# Patient Record
Sex: Female | Born: 1945 | Hispanic: No | Smoking: Never smoker
Health system: Southern US, Community
[De-identification: ages and names within clinical notes are randomized; demographics above are authoritative.]

## PROBLEM LIST (undated history)

## (undated) DIAGNOSIS — E119 Type 2 diabetes mellitus without complications: Secondary | ICD-10-CM

## (undated) DIAGNOSIS — E785 Hyperlipidemia, unspecified: Secondary | ICD-10-CM

## (undated) DIAGNOSIS — E079 Disorder of thyroid, unspecified: Secondary | ICD-10-CM

## (undated) DIAGNOSIS — M5136 Other intervertebral disc degeneration, lumbar region: Secondary | ICD-10-CM

## (undated) DIAGNOSIS — M51369 Other intervertebral disc degeneration, lumbar region without mention of lumbar back pain or lower extremity pain: Secondary | ICD-10-CM

## (undated) HISTORY — DX: Disorder of thyroid, unspecified: E07.9

## (undated) HISTORY — DX: Other intervertebral disc degeneration, lumbar region without mention of lumbar back pain or lower extremity pain: M51.369

## (undated) HISTORY — PX: TUBAL LIGATION: SHX77

## (undated) HISTORY — DX: Hyperlipidemia, unspecified: E78.5

## (undated) HISTORY — DX: Type 2 diabetes mellitus without complications: E11.9

## (undated) HISTORY — DX: Other intervertebral disc degeneration, lumbar region: M51.36

## (undated) HISTORY — PX: ROTATOR CUFF REPAIR: SHX139

---

## 2014-04-18 ENCOUNTER — Ambulatory Visit: Payer: Self-pay | Admitting: Family Medicine

## 2014-04-23 ENCOUNTER — Ambulatory Visit: Payer: Self-pay | Admitting: Internal Medicine

## 2014-04-23 LAB — CBC CANCER CENTER
BASOS PCT: 0.6 %
Basophil #: 0 x10 3/mm (ref 0.0–0.1)
Comment - H1-Com1: NORMAL
Eosinophil #: 0.2 x10 3/mm (ref 0.0–0.7)
Eosinophil %: 2.6 %
Eosinophil: 6 %
HCT: 39.4 % (ref 35.0–47.0)
HGB: 13.2 g/dL (ref 12.0–16.0)
LYMPHS ABS: 2.7 x10 3/mm (ref 1.0–3.6)
Lymphocyte %: 43.2 %
Lymphocytes: 42 %
MCH: 28.4 pg (ref 26.0–34.0)
MCHC: 33.6 g/dL (ref 32.0–36.0)
MCV: 85 fL (ref 80–100)
MONOS PCT: 6 %
MONOS PCT: 8.2 %
Monocyte #: 0.5 x10 3/mm (ref 0.2–0.9)
Neutrophil #: 2.9 x10 3/mm (ref 1.4–6.5)
Neutrophil %: 45.4 %
PLATELETS: 273 x10 3/mm (ref 150–440)
RBC: 4.65 10*6/uL (ref 3.80–5.20)
RDW: 13.6 % (ref 11.5–14.5)
SEGMENTED NEUTROPHILS: 46 %
WBC: 6.3 x10 3/mm (ref 3.6–11.0)

## 2014-04-23 LAB — LACTATE DEHYDROGENASE: LDH: 189 U/L (ref 81–246)

## 2014-04-23 LAB — LIPASE, BLOOD: LIPASE: 470 U/L — AB (ref 73–393)

## 2014-04-25 ENCOUNTER — Ambulatory Visit: Payer: Self-pay | Admitting: Internal Medicine

## 2015-01-05 DIAGNOSIS — E119 Type 2 diabetes mellitus without complications: Secondary | ICD-10-CM | POA: Diagnosis not present

## 2015-01-05 DIAGNOSIS — M25561 Pain in right knee: Secondary | ICD-10-CM | POA: Diagnosis not present

## 2015-01-05 DIAGNOSIS — E559 Vitamin D deficiency, unspecified: Secondary | ICD-10-CM | POA: Diagnosis not present

## 2015-01-05 DIAGNOSIS — E782 Mixed hyperlipidemia: Secondary | ICD-10-CM | POA: Diagnosis not present

## 2015-01-05 DIAGNOSIS — E039 Hypothyroidism, unspecified: Secondary | ICD-10-CM | POA: Diagnosis not present

## 2015-02-23 DIAGNOSIS — E119 Type 2 diabetes mellitus without complications: Secondary | ICD-10-CM | POA: Diagnosis not present

## 2015-02-23 DIAGNOSIS — J329 Chronic sinusitis, unspecified: Secondary | ICD-10-CM | POA: Diagnosis not present

## 2015-02-23 DIAGNOSIS — E785 Hyperlipidemia, unspecified: Secondary | ICD-10-CM | POA: Diagnosis not present

## 2015-03-09 DIAGNOSIS — E119 Type 2 diabetes mellitus without complications: Secondary | ICD-10-CM | POA: Diagnosis not present

## 2015-05-19 DIAGNOSIS — M1990 Unspecified osteoarthritis, unspecified site: Secondary | ICD-10-CM | POA: Diagnosis not present

## 2015-05-19 DIAGNOSIS — H6123 Impacted cerumen, bilateral: Secondary | ICD-10-CM | POA: Diagnosis not present

## 2015-10-28 DIAGNOSIS — E119 Type 2 diabetes mellitus without complications: Secondary | ICD-10-CM | POA: Diagnosis not present

## 2015-10-28 DIAGNOSIS — E559 Vitamin D deficiency, unspecified: Secondary | ICD-10-CM | POA: Diagnosis not present

## 2015-10-28 DIAGNOSIS — E039 Hypothyroidism, unspecified: Secondary | ICD-10-CM | POA: Diagnosis not present

## 2015-10-28 DIAGNOSIS — E782 Mixed hyperlipidemia: Secondary | ICD-10-CM | POA: Diagnosis not present

## 2015-11-04 DIAGNOSIS — M1712 Unilateral primary osteoarthritis, left knee: Secondary | ICD-10-CM | POA: Diagnosis not present

## 2015-11-04 DIAGNOSIS — E119 Type 2 diabetes mellitus without complications: Secondary | ICD-10-CM | POA: Diagnosis not present

## 2015-11-04 DIAGNOSIS — D649 Anemia, unspecified: Secondary | ICD-10-CM | POA: Diagnosis not present

## 2015-11-04 DIAGNOSIS — E7800 Pure hypercholesterolemia, unspecified: Secondary | ICD-10-CM | POA: Diagnosis not present

## 2015-11-04 DIAGNOSIS — E039 Hypothyroidism, unspecified: Secondary | ICD-10-CM | POA: Diagnosis not present

## 2015-11-13 DIAGNOSIS — D649 Anemia, unspecified: Secondary | ICD-10-CM | POA: Diagnosis not present

## 2016-03-26 DIAGNOSIS — M47816 Spondylosis without myelopathy or radiculopathy, lumbar region: Secondary | ICD-10-CM | POA: Diagnosis not present

## 2016-03-27 DIAGNOSIS — M25551 Pain in right hip: Secondary | ICD-10-CM | POA: Diagnosis not present

## 2016-03-27 DIAGNOSIS — M25561 Pain in right knee: Secondary | ICD-10-CM | POA: Diagnosis not present

## 2016-05-26 DIAGNOSIS — M47812 Spondylosis without myelopathy or radiculopathy, cervical region: Secondary | ICD-10-CM | POA: Diagnosis not present

## 2016-05-26 DIAGNOSIS — M5442 Lumbago with sciatica, left side: Secondary | ICD-10-CM | POA: Diagnosis not present

## 2016-05-26 DIAGNOSIS — E119 Type 2 diabetes mellitus without complications: Secondary | ICD-10-CM | POA: Diagnosis not present

## 2016-05-26 DIAGNOSIS — M5136 Other intervertebral disc degeneration, lumbar region: Secondary | ICD-10-CM | POA: Diagnosis not present

## 2016-05-26 DIAGNOSIS — S161XXA Strain of muscle, fascia and tendon at neck level, initial encounter: Secondary | ICD-10-CM | POA: Diagnosis not present

## 2016-05-26 DIAGNOSIS — H9193 Unspecified hearing loss, bilateral: Secondary | ICD-10-CM | POA: Diagnosis not present

## 2016-05-26 DIAGNOSIS — M5441 Lumbago with sciatica, right side: Secondary | ICD-10-CM | POA: Diagnosis not present

## 2016-06-20 DIAGNOSIS — H90A31 Mixed conductive and sensorineural hearing loss, unilateral, right ear with restricted hearing on the contralateral side: Secondary | ICD-10-CM | POA: Diagnosis not present

## 2016-06-20 DIAGNOSIS — H6123 Impacted cerumen, bilateral: Secondary | ICD-10-CM | POA: Diagnosis not present

## 2016-06-20 DIAGNOSIS — H6980 Other specified disorders of Eustachian tube, unspecified ear: Secondary | ICD-10-CM | POA: Diagnosis not present

## 2016-08-23 DIAGNOSIS — E119 Type 2 diabetes mellitus without complications: Secondary | ICD-10-CM | POA: Diagnosis not present

## 2016-08-23 DIAGNOSIS — M5442 Lumbago with sciatica, left side: Secondary | ICD-10-CM | POA: Diagnosis not present

## 2016-08-23 DIAGNOSIS — S161XXA Strain of muscle, fascia and tendon at neck level, initial encounter: Secondary | ICD-10-CM | POA: Diagnosis not present

## 2016-08-23 DIAGNOSIS — M5441 Lumbago with sciatica, right side: Secondary | ICD-10-CM | POA: Diagnosis not present

## 2016-08-30 DIAGNOSIS — E7800 Pure hypercholesterolemia, unspecified: Secondary | ICD-10-CM | POA: Diagnosis not present

## 2016-08-30 DIAGNOSIS — E119 Type 2 diabetes mellitus without complications: Secondary | ICD-10-CM | POA: Diagnosis not present

## 2016-08-30 DIAGNOSIS — E039 Hypothyroidism, unspecified: Secondary | ICD-10-CM | POA: Diagnosis not present

## 2016-09-12 ENCOUNTER — Encounter: Payer: Commercial Managed Care - HMO | Attending: Internal Medicine | Admitting: Dietician

## 2016-09-12 ENCOUNTER — Encounter: Payer: Self-pay | Admitting: Dietician

## 2016-09-12 VITALS — BP 122/72 | Ht 60.0 in | Wt 148.6 lb

## 2016-09-12 DIAGNOSIS — E119 Type 2 diabetes mellitus without complications: Secondary | ICD-10-CM | POA: Diagnosis not present

## 2016-09-12 DIAGNOSIS — E785 Hyperlipidemia, unspecified: Secondary | ICD-10-CM | POA: Diagnosis not present

## 2016-09-12 NOTE — Patient Instructions (Signed)
  Check blood sugars 2 x day before breakfast and 2 hrs after supper every day  Exercise:  Continue riding bike, elliptical and water aerobics  for    45  minutes   4  days a week  Avoid sugar sweetened drinks (soda, tea, coffee, sports drinks, juices)  Avoid fried foods and sweets/dessersts  Eat 3 meals day,   1  snacks a day at bedtime  Space meals 4-6 hours apart  Make dentist / eye doctor appointments  Bring blood sugar records to the next appointment/class  Get a Sharps container  Return for appointment/classes on:  Call about  Scheduling  classes after talking to insurance company about coverage

## 2016-09-12 NOTE — Progress Notes (Signed)
Diabetes Self-Management Education  Visit Type: First/Initial  Appt. Start Time: 1100 Appt. End Time: 1200  09/12/2016  [name], identified by name and date of birth, is a 70 y.o. female with a diagnosis of Diabetes: Type 2.   ASSESSMENT  Blood pressure 122/72, height 5' (1.524 m), weight 148 lb 9.6 oz (67.4 kg). Body mass index is 29.02 kg/m.  Pt reports having some pain in back and knees if walks and stands a lot which she avoids doing-no pain at present time      Diabetes Self-Management Education - 09/12/16 1212      Visit Information   Visit Type First/Initial     Initial Visit   Diabetes Type Type 2     Health Coping   How would you rate your overall health? Good     Psychosocial Assessment   Patient Belief/Attitude about Diabetes Motivated to manage diabetes   Self-care barriers None   Patient Concerns Glycemic Control;Weight Control   Special Needs None   Preferred Learning Style Auditory;Visual   Learning Readiness Ready     Pre-Education Assessment   Patient understands the diabetes disease and treatment process. Needs Review   Patient understands incorporating nutritional management into lifestyle. Needs Review   Patient undertands incorporating physical activity into lifestyle. Needs Review   Patient understands using medications safely. Needs Review   Patient understands monitoring blood glucose, interpreting and using results Needs Review   Patient understands prevention, detection, and treatment of acute complications. Needs Review   Patient understands prevention, detection, and treatment of chronic complications. Needs Review   Patient understands how to develop strategies to address psychosocial issues. Needs Review   Patient understands how to develop strategies to promote health/change behavior. Needs Review     Complications   Last HgB A1C per patient/outside source 7.3 %  08-23-16   How often do you check your blood sugar? --   1x/day   Fasting Blood glucose range (mg/dL) 40-981   Have you had a dilated eye exam in the past 12 months? No  about 1 year ago   Have you had a dental exam in the past 12 months? No  3 years ago   Are you checking your feet? Yes   How many days per week are you checking your feet? 7     Dietary Intake   Breakfast --  awakes at 5-7am-eats breakfast at 9:30am=usually eats oatmeal and fruit    Snack (morning) --  none   Lunch --  eats at 2-2:30p=eats egg or meat, rice, pita and vegetables   Snack (afternoon) --  eats snack at 5p=chips, unsweet tea   Dinner --  eats supper at 7:30p=yogurt, fruit, tortilla   Snack (evening) --  none   Beverage(s) --  drinks water 2-3x/day and unsweet tea and coffee 2-3x/day; milk x1/day     Exercise   Exercise Type --  goes to [employer]-cycles, elliptical and water aerobics 45 min 3-4x/wk.     Patient Education   Previous Diabetes Education No   Disease state  Definition of diabetes, type 1 and 2, and the diagnosis of diabetes;Explored patient's options for treatment of their diabetes   Nutrition management  Role of diet in the treatment of diabetes and the relationship between the three main macronutrients and blood glucose level;Food label reading, portion sizes and measuring food.;Carbohydrate counting   Physical activity and exercise  Role of exercise on diabetes management, blood pressure control and cardiac health.;Helped patient identify appropriate  exercises in relation to his/her diabetes, diabetes complications and other health issue.   Medications Reviewed patients medication for diabetes, action, purpose, timing of dose and side effects.   Monitoring Purpose and frequency of SMBG.;Identified appropriate SMBG and/or A1C goals.;Taught/discussed recording of test results and interpretation of SMBG.;Yearly dilated eye exam   Chronic complications Relationship between chronic complications and blood glucose control;Dental care;Retinopathy and  reason for yearly dilated eye exams   Psychosocial adjustment Role of stress on diabetes   Personal strategies to promote health Lifestyle issues that need to be addressed for better diabetes care;Helped patient develop diabetes management plan for (enter comment)      Individualized Plan for Diabetes Self-Management Training:   Learning Objective:  Patient will have a greater understanding of diabetes self-management. Patient education plan is to attend individual and/or group sessions per assessed needs and concerns.   Plan:   Patient Instructions   Check blood sugars 2 x day before breakfast and 2 hrs after supper every day  Exercise:  Continue riding bike, elliptical and water aerobics  for    45  minutes   4  days a week  Avoid sugar sweetened drinks (soda, tea, coffee, sports drinks, juices)  Avoid fried foods and sweets/dessersts  Eat 3 meals day,   1  snacks a day at bedtime  Space meals 4-6 hours apart  Make dentist / eye doctor appointments  Bring blood sugar records to the next appointment/class  Get a Sharps container  Return for appointment/classes on:  Call about  Scheduling  classes after talking to insurance company about coverage   Expected Outcomes:   positive  Education material provided: [employer] Meal Planning Guidelines, Living Well With Diabetes booklet, A1C handout  If problems or questions, patient to contact team via:  [phone]  Future DSME appointment:  pt to call about scheduling classes after talking with insurance company about coverage

## 2016-10-03 ENCOUNTER — Telehealth: Payer: Self-pay | Admitting: Dietician

## 2016-10-03 NOTE — Telephone Encounter (Signed)
Have not heard from pt-called pt's home number but wrong number. Called pt's daughter and asked that she have pt call me about scheduling diabetes classes.

## 2016-10-04 ENCOUNTER — Other Ambulatory Visit: Payer: Self-pay | Admitting: Internal Medicine

## 2016-10-04 DIAGNOSIS — Z1239 Encounter for other screening for malignant neoplasm of breast: Secondary | ICD-10-CM

## 2016-10-18 ENCOUNTER — Encounter: Payer: Self-pay | Admitting: Dietician

## 2016-10-18 NOTE — Progress Notes (Signed)
Have not heard from pt-pt discharged from program

## 2016-11-07 ENCOUNTER — Ambulatory Visit: Payer: Commercial Managed Care - HMO

## 2016-11-23 DIAGNOSIS — E119 Type 2 diabetes mellitus without complications: Secondary | ICD-10-CM | POA: Diagnosis not present

## 2016-11-24 DIAGNOSIS — E785 Hyperlipidemia, unspecified: Secondary | ICD-10-CM | POA: Diagnosis not present

## 2016-11-24 DIAGNOSIS — E119 Type 2 diabetes mellitus without complications: Secondary | ICD-10-CM | POA: Diagnosis not present

## 2016-11-24 DIAGNOSIS — E7800 Pure hypercholesterolemia, unspecified: Secondary | ICD-10-CM | POA: Diagnosis not present

## 2016-12-13 ENCOUNTER — Encounter (HOSPITAL_COMMUNITY): Payer: Self-pay

## 2016-12-13 ENCOUNTER — Ambulatory Visit
Admission: RE | Admit: 2016-12-13 | Discharge: 2016-12-13 | Disposition: A | Discharge status: Home / Self Care | Payer: Commercial Managed Care - HMO | Source: Ambulatory Visit | Attending: Internal Medicine | Admitting: Internal Medicine

## 2016-12-13 DIAGNOSIS — Z1239 Encounter for other screening for malignant neoplasm of breast: Secondary | ICD-10-CM

## 2016-12-13 DIAGNOSIS — Z1231 Encounter for screening mammogram for malignant neoplasm of breast: Secondary | ICD-10-CM | POA: Diagnosis not present

## 2016-12-23 ENCOUNTER — Other Ambulatory Visit: Payer: Self-pay | Admitting: *Deleted

## 2016-12-23 ENCOUNTER — Inpatient Hospital Stay
Admission: RE | Admit: 2016-12-23 | Discharge: 2016-12-23 | Disposition: A | Discharge status: Home / Self Care | Payer: Self-pay | Source: Ambulatory Visit | Attending: *Deleted | Admitting: *Deleted

## 2016-12-23 DIAGNOSIS — Z9289 Personal history of other medical treatment: Secondary | ICD-10-CM

## 2016-12-28 ENCOUNTER — Other Ambulatory Visit: Payer: Self-pay | Admitting: Internal Medicine

## 2016-12-28 DIAGNOSIS — N6320 Unspecified lump in the left breast, unspecified quadrant: Secondary | ICD-10-CM

## 2016-12-28 DIAGNOSIS — R928 Other abnormal and inconclusive findings on diagnostic imaging of breast: Secondary | ICD-10-CM

## 2017-01-04 ENCOUNTER — Ambulatory Visit
Admission: RE | Admit: 2017-01-04 | Discharge: 2017-01-04 | Disposition: A | Discharge status: Home / Self Care | Payer: Commercial Managed Care - HMO | Source: Ambulatory Visit | Attending: Internal Medicine | Admitting: Internal Medicine

## 2017-01-04 ENCOUNTER — Other Ambulatory Visit: Payer: Self-pay | Admitting: Internal Medicine

## 2017-01-04 DIAGNOSIS — N6489 Other specified disorders of breast: Secondary | ICD-10-CM | POA: Diagnosis not present

## 2017-01-04 DIAGNOSIS — N6320 Unspecified lump in the left breast, unspecified quadrant: Secondary | ICD-10-CM

## 2017-01-04 DIAGNOSIS — R928 Other abnormal and inconclusive findings on diagnostic imaging of breast: Secondary | ICD-10-CM

## 2017-03-01 DIAGNOSIS — E119 Type 2 diabetes mellitus without complications: Secondary | ICD-10-CM | POA: Diagnosis not present

## 2017-03-08 DIAGNOSIS — E039 Hypothyroidism, unspecified: Secondary | ICD-10-CM | POA: Diagnosis not present

## 2017-03-08 DIAGNOSIS — E119 Type 2 diabetes mellitus without complications: Secondary | ICD-10-CM | POA: Diagnosis not present

## 2017-03-08 DIAGNOSIS — E7800 Pure hypercholesterolemia, unspecified: Secondary | ICD-10-CM | POA: Diagnosis not present

## 2017-04-03 DIAGNOSIS — E119 Type 2 diabetes mellitus without complications: Secondary | ICD-10-CM | POA: Diagnosis not present

## 2017-04-03 DIAGNOSIS — M25562 Pain in left knee: Secondary | ICD-10-CM | POA: Diagnosis not present

## 2017-04-04 DIAGNOSIS — E119 Type 2 diabetes mellitus without complications: Secondary | ICD-10-CM | POA: Diagnosis not present

## 2017-04-06 DIAGNOSIS — M1712 Unilateral primary osteoarthritis, left knee: Secondary | ICD-10-CM | POA: Diagnosis not present

## 2017-07-11 DIAGNOSIS — E119 Type 2 diabetes mellitus without complications: Secondary | ICD-10-CM | POA: Diagnosis not present

## 2017-07-18 DIAGNOSIS — E039 Hypothyroidism, unspecified: Secondary | ICD-10-CM | POA: Diagnosis not present

## 2017-07-18 DIAGNOSIS — E7800 Pure hypercholesterolemia, unspecified: Secondary | ICD-10-CM | POA: Diagnosis not present

## 2017-07-18 DIAGNOSIS — Z23 Encounter for immunization: Secondary | ICD-10-CM | POA: Diagnosis not present

## 2017-07-18 DIAGNOSIS — E119 Type 2 diabetes mellitus without complications: Secondary | ICD-10-CM | POA: Diagnosis not present

## 2017-07-18 DIAGNOSIS — Z0000 Encounter for general adult medical examination without abnormal findings: Secondary | ICD-10-CM | POA: Diagnosis not present

## 2017-07-18 DIAGNOSIS — Z780 Asymptomatic menopausal state: Secondary | ICD-10-CM | POA: Diagnosis not present

## 2017-08-02 ENCOUNTER — Encounter: Payer: Self-pay | Admitting: *Deleted

## 2017-08-02 ENCOUNTER — Encounter: Payer: Commercial Managed Care - HMO | Attending: Internal Medicine | Admitting: *Deleted

## 2017-08-02 VITALS — BP 122/76 | Ht 60.0 in | Wt 150.7 lb

## 2017-08-02 DIAGNOSIS — Z713 Dietary counseling and surveillance: Secondary | ICD-10-CM | POA: Diagnosis not present

## 2017-08-02 DIAGNOSIS — E119 Type 2 diabetes mellitus without complications: Secondary | ICD-10-CM | POA: Insufficient documentation

## 2017-08-02 NOTE — Patient Instructions (Signed)
Check blood sugars 1 x day before breakfast or 2 hrs after lunch every day Bring blood sugar records to the next appointment  Exercise: Continue walking/cycling for   30  minutes   5 days a week  Eat 3 meals day,   1-2 snacks a day Space meals 4-6 hours apart Add protein serving to breakfast Allow 2 hours between meals and snacks Complete 3 Day Food Record and bring to next appt  Carry fast acting glucose and a snack at all times  Return for appointment on:  Will call tomorrow when hospital computer is up to schedule follow up appointment with the dietitian

## 2017-08-03 NOTE — Progress Notes (Signed)
Diabetes Self-Management Education  Visit Type: First/Initial  Appt. Start Time: 1340 Appt. End Time: 1455  08/02/2017  [name], identified by name and date of birth, is a 71 y.o. female with a diagnosis of Diabetes: Type 2.   ASSESSMENT  Blood pressure 122/76, height 5' (1.524 m), weight 150 lb 11.2 oz (68.4 kg). Body mass index is 29.43 kg/m.      Diabetes Self-Management Education - 08/02/17 1500      Visit Information   Visit Type First/Initial     Initial Visit   Diabetes Type Type 2   Are you currently following a meal plan? No   Are you taking your medications as prescribed? Yes   Date Diagnosed 15 years     Health Coping   How would you rate your overall health? Good     Psychosocial Assessment   Patient Belief/Attitude about Diabetes Motivated to manage diabetes   Self-care barriers None   Self-management support Doctor's office;Family   Patient Concerns Nutrition/Meal planning;Glycemic Control;Weight Control   Special Needs None   Preferred Learning Style Hands on   Learning Readiness Ready   How often do you need to have someone help you when you read instructions, pamphlets, or other written materials from your doctor or pharmacy? 1 - Never   What is the last grade level you completed in school? high school     Pre-Education Assessment   Patient understands the diabetes disease and treatment process. Needs Instruction   Patient understands incorporating nutritional management into lifestyle. Needs Instruction   Patient undertands incorporating physical activity into lifestyle. Needs Instruction   Patient understands using medications safely. Needs Instruction   Patient understands monitoring blood glucose, interpreting and using results Needs Review   Patient understands prevention, detection, and treatment of acute complications. Needs Instruction   Patient understands prevention, detection, and treatment of chronic complications. Needs  Instruction   Patient understands how to develop strategies to address psychosocial issues. Needs Instruction   Patient understands how to develop strategies to promote health/change behavior. Needs Instruction     Complications   Last HgB A1C per patient/outside source 7.3 %  07/11/17   How often do you check your blood sugar? 1-2 times/day   Fasting Blood glucose range (mg/dL) 16-109  Pt reports FBG's 100-120's mg/dL.   Number of hypoglycemic episodes per month 2   Can you tell when your blood sugar is low? Yes   What do you do if your blood sugar is low? Someone gave her juice. She ate crackers first and didn't help.    Have you had a dilated eye exam in the past 12 months? Yes   Have you had a dental exam in the past 12 months? No   Are you checking your feet? Yes   How many days per week are you checking your feet? 5     Dietary Intake   Breakfast banana, 1/3 cup oatmeal and 1/2 cup milk   Lunch [place] sandwich; wheat tortillas with kale, cuccmber, broccoli, cauliflower, egg plant, spinach   Snack (afternoon) 3-4 peanut butter crackers or [employer] chips with hummus   Dinner tortilla, fruit - cherries, cooked rice, beans, fish, tuna, shrimp   Beverage(s) water, unsweetened tea, coffee     Exercise   Exercise Type Moderate (swimming / aerobic walking)  walking, cycling   How many days per week to you exercise? 30   How many minutes per day do you exercise? 5   Total  minutes per week of exercise 150     Patient Education   Previous Diabetes Education Yes (please comment)  Pt came for 1:1 appt last year but never followed up with classes.    Disease state  Definition of diabetes, type 1 and 2, and the diagnosis of diabetes   Nutrition management  Role of diet in the treatment of diabetes and the relationship between the three main macronutrients and blood glucose level;Food label reading, portion sizes and measuring food.;Reviewed blood glucose goals for pre and post meals and  how to evaluate the patients' food intake on their blood glucose level.   Physical activity and exercise  Role of exercise on diabetes management, blood pressure control and cardiac health.   Medications Reviewed patients medication for diabetes, action, purpose, timing of dose and side effects.   Monitoring Purpose and frequency of SMBG.;Taught/discussed recording of test results and interpretation of SMBG.;Identified appropriate SMBG and/or A1C goals.  Pt requested fingerstick BG while at appt. BG was 135 mg/dL at 1:612:05 pm - 1 hr pp.    Acute complications Taught treatment of hypoglycemia - the 15 rule.   Chronic complications Relationship between chronic complications and blood glucose control;Dental care   Psychosocial adjustment Identified and addressed patients feelings and concerns about diabetes     Individualized Goals (developed by patient)   Reducing Risk Improve blood sugars Lose weight     Outcomes   Expected Outcomes Demonstrated interest in learning. Expect positive outcomes      Individualized Plan for Diabetes Self-Management Training:   Learning Objective:  Patient will have a greater understanding of diabetes self-management. Patient education plan is to attend individual and/or group sessions per assessed needs and concerns.   Plan:   Patient Instructions  Check blood sugars 1 x day before breakfast or 2 hrs after lunch every day Bring blood sugar records to the next appointment Exercise: Continue walking/cycling for   30  minutes   5 days a week Eat 3 meals day,   1-2 snacks a day Space meals 4-6 hours apart Add protein serving to breakfast Allow 2 hours between meals and snacks Complete 3 Day Food Record and bring to next appt Carry fast acting glucose and a snack at all times   Expected Outcomes:  Demonstrated interest in learning. Expect positive outcomes  Education material provided:  General Meal Planning Guidelines Simple Meal Plan 3 Day Food  Record Glucose tablets Symptoms, causes and treatments of Hypoglycemia  If problems or questions, patient to contact team via:  Sharion SettlerSheila Rob Mciver, RN, CCM, CDE [phone]  Future DSME appointment:  Will call tomorrow to schedule follow up with the dietitian when the hospital computer is up.

## 2017-08-15 ENCOUNTER — Encounter: Payer: Commercial Managed Care - HMO | Admitting: Dietician

## 2017-08-15 ENCOUNTER — Encounter: Payer: Self-pay | Admitting: Dietician

## 2017-08-15 DIAGNOSIS — Z713 Dietary counseling and surveillance: Secondary | ICD-10-CM | POA: Diagnosis not present

## 2017-08-15 DIAGNOSIS — E119 Type 2 diabetes mellitus without complications: Secondary | ICD-10-CM | POA: Diagnosis not present

## 2017-08-15 NOTE — Progress Notes (Signed)
Diabetes Self-Management Education  Visit Type:  Follow-up  Appt. Start Time: 1330 Appt. End Time: 1430  08/15/2017  Ms. Lezley Biglow, identified by name and date of birth, is a 71 y.o. female with a diagnosis of Diabetes:  Type 2 Diabetes.   ASSESSMENT  Height 5' (1.524 m), weight 150 lb 12.8 oz (68.4 kg). Body mass index is 29.45 kg/m.  BP- 120/68       Diabetes Self-Management Education - 08/15/17 1450      Complications   Last HgB A1C per patient/outside source 7.3 %   How often do you check your blood sugar? 1-2 times/day   Fasting Blood glucose range (mg/dL) 34-193   Postprandial Blood glucose range (mg/dL) 790-240   Have you had a dilated eye exam in the past 12 months? Yes   Have you had a dental exam in the past 12 months? No   Are you checking your feet? Yes   How many days per week are you checking your feet? 5     Dietary Intake   Breakfast oatmeal 2/3 cup cooked, 1/4 cup milk, 1/2 banana, 1 Tbsp peanut butter   Lunch 12 in tortilla or 10" Naan bread, with egg or [place], rice and/or beans, vegetables including broccoli, or cucumber or egg plant or squash   Snack (afternoon) 1 package of peanut butter/crackers or 100 calorie snack packs or apple/peanut butter occasionally with peanuts added   Dinner same as lunch   Snack (evening) yogurt    Beverage(s) water, unsweetened tea, coffee     Exercise   Exercise Type Light (walking / raking leaves)   How many days per week to you exercise? 5   How many minutes per day do you exercise? 30   Total minutes per week of exercise 150     Patient Education   Nutrition management  Role of diet in the treatment of diabetes and the relationship between the three main macronutrients and blood glucose level;Food label reading, portion sizes and measuring food.;Carbohydrate counting;Reviewed blood glucose goals for pre and post meals and how to evaluate the patients' food intake on their blood glucose level.   Physical  activity and exercise  Role of exercise on diabetes management, blood pressure control and cardiac health.   Acute complications --  reviewed prevention and treatment of hypoglycemia   Chronic complications Lipid levels, blood glucose control and heart disease     Individualized Goals (developed by patient)   Nutrition Follow meal plan discussed   Physical Activity Exercise 5-7 days per week   Monitoring  test my blood glucose as discussed   Reducing Risk treat hypoglycemia with 15 grams of carbs if blood glucose less than 70mg /dL;do foot checks daily     Post-Education Assessment   Patient understands the diabetes disease and treatment process. Demonstrates understanding / competency   Patient understands incorporating nutritional management into lifestyle. Demonstrates understanding / competency   Patient undertands incorporating physical activity into lifestyle. Demonstrates understanding / competency   Patient understands using medications safely. Demonstrates understanding / competency   Patient understands monitoring blood glucose, interpreting and using results Demonstrates understanding / competency   Patient understands prevention, detection, and treatment of acute complications. Demonstrates understanding / competency   Patient understands how to develop strategies to address psychosocial issues. Demonstrates understanding / competency   Patient understands how to develop strategies to promote health/change behavior. Demonstrates understanding / competency     Outcomes   Program Status Completed  Learning Objective:  Patient will have a greater understanding of diabetes self-management. Patient education plan is to attend individual and/or group sessions per assessed needs and concerns.   Plan:   Patient to balance meals with 1-3 oz protein, 2-3 servings of carbohydrate and non-starchy vegetables. To read labels for carbohydrate, keeping meals in a 30-50 gm carbohydrate  range. To use calorieking website to look up nutrition information for foods not listed in hand-out that was given. To continue to check FBG and 2 hour post meal glucose readings several days per week.  Expected Outcomes:  Demonstrated interest in learning. Expect positive outcomes  Education material provided:  Diabetes Food Guide Plate Planning a Balance Meal  If problems or questions, patient to contact team via:  Darrel Reach, RD  [phone]  Future DSME appointment: - PRN

## 2017-08-15 NOTE — Patient Instructions (Signed)
Patient to balance meals with 1-3 oz protein, 2-3 servings of carbohydrate and non-starchy vegetables. To read labels for carbohydrate, keeping meals in a 30-50 gm carbohydrate range. To use calorieking website to look up nutrition information for foods not listed in hand-out that was given. To continue to check FBG and 2 hour post meal glucose readings several days per week.

## 2017-10-11 DIAGNOSIS — E039 Hypothyroidism, unspecified: Secondary | ICD-10-CM | POA: Diagnosis not present

## 2017-10-11 DIAGNOSIS — E7800 Pure hypercholesterolemia, unspecified: Secondary | ICD-10-CM | POA: Diagnosis not present

## 2017-10-11 DIAGNOSIS — E119 Type 2 diabetes mellitus without complications: Secondary | ICD-10-CM | POA: Diagnosis not present

## 2017-10-18 DIAGNOSIS — E538 Deficiency of other specified B group vitamins: Secondary | ICD-10-CM | POA: Diagnosis not present

## 2017-10-18 DIAGNOSIS — Z0000 Encounter for general adult medical examination without abnormal findings: Secondary | ICD-10-CM | POA: Diagnosis not present

## 2017-10-18 DIAGNOSIS — Z780 Asymptomatic menopausal state: Secondary | ICD-10-CM | POA: Diagnosis not present

## 2017-10-18 DIAGNOSIS — R202 Paresthesia of skin: Secondary | ICD-10-CM | POA: Diagnosis not present

## 2017-10-18 DIAGNOSIS — E119 Type 2 diabetes mellitus without complications: Secondary | ICD-10-CM | POA: Diagnosis not present

## 2017-10-20 DIAGNOSIS — Z780 Asymptomatic menopausal state: Secondary | ICD-10-CM | POA: Diagnosis not present

## 2018-01-02 DIAGNOSIS — M1712 Unilateral primary osteoarthritis, left knee: Secondary | ICD-10-CM | POA: Diagnosis not present

## 2018-01-02 DIAGNOSIS — E119 Type 2 diabetes mellitus without complications: Secondary | ICD-10-CM | POA: Diagnosis not present

## 2018-01-02 DIAGNOSIS — E538 Deficiency of other specified B group vitamins: Secondary | ICD-10-CM | POA: Diagnosis not present

## 2018-01-02 DIAGNOSIS — E7800 Pure hypercholesterolemia, unspecified: Secondary | ICD-10-CM | POA: Diagnosis not present

## 2018-02-09 DIAGNOSIS — E538 Deficiency of other specified B group vitamins: Secondary | ICD-10-CM | POA: Diagnosis not present

## 2018-02-21 DIAGNOSIS — M1712 Unilateral primary osteoarthritis, left knee: Secondary | ICD-10-CM | POA: Diagnosis not present

## 2018-02-21 DIAGNOSIS — R100 Acute abdomen: Secondary | ICD-10-CM | POA: Diagnosis not present

## 2018-11-30 ENCOUNTER — Encounter: Payer: Self-pay | Admitting: Emergency Medicine

## 2018-11-30 ENCOUNTER — Other Ambulatory Visit: Payer: Self-pay

## 2018-11-30 ENCOUNTER — Emergency Department: Payer: PRIVATE HEALTH INSURANCE

## 2018-11-30 ENCOUNTER — Emergency Department
Admission: EM | Admit: 2018-11-30 | Discharge: 2018-11-30 | Disposition: A | Discharge status: Home / Self Care | Payer: PRIVATE HEALTH INSURANCE | Attending: Emergency Medicine | Admitting: Emergency Medicine

## 2018-11-30 DIAGNOSIS — Y9389 Activity, other specified: Secondary | ICD-10-CM | POA: Insufficient documentation

## 2018-11-30 DIAGNOSIS — S8001XA Contusion of right knee, initial encounter: Secondary | ICD-10-CM | POA: Insufficient documentation

## 2018-11-30 DIAGNOSIS — S60512A Abrasion of left hand, initial encounter: Secondary | ICD-10-CM | POA: Insufficient documentation

## 2018-11-30 DIAGNOSIS — E119 Type 2 diabetes mellitus without complications: Secondary | ICD-10-CM | POA: Insufficient documentation

## 2018-11-30 DIAGNOSIS — R51 Headache: Secondary | ICD-10-CM | POA: Insufficient documentation

## 2018-11-30 DIAGNOSIS — W01198A Fall on same level from slipping, tripping and stumbling with subsequent striking against other object, initial encounter: Secondary | ICD-10-CM | POA: Diagnosis not present

## 2018-11-30 DIAGNOSIS — Y92538 Other ambulatory health services establishments as the place of occurrence of the external cause: Secondary | ICD-10-CM | POA: Insufficient documentation

## 2018-11-30 DIAGNOSIS — Y999 Unspecified external cause status: Secondary | ICD-10-CM | POA: Diagnosis not present

## 2018-11-30 DIAGNOSIS — S8002XA Contusion of left knee, initial encounter: Secondary | ICD-10-CM | POA: Insufficient documentation

## 2018-11-30 DIAGNOSIS — W19XXXA Unspecified fall, initial encounter: Secondary | ICD-10-CM

## 2018-11-30 DIAGNOSIS — S0990XA Unspecified injury of head, initial encounter: Secondary | ICD-10-CM | POA: Diagnosis present

## 2018-11-30 DIAGNOSIS — T148XXA Other injury of unspecified body region, initial encounter: Secondary | ICD-10-CM

## 2018-11-30 MED ORDER — BACITRACIN ZINC 500 UNIT/GM EX OINT
TOPICAL_OINTMENT | CUTANEOUS | Status: AC
  Administered 2018-11-30: 11:00:00
  Filled 2018-11-30: qty 0.9

## 2018-11-30 NOTE — ED Triage Notes (Signed)
Patient reports walking into hospital and tripping on uneven sidewalk. Patient with scrapes to palm of left hand and knees. Patient also states that she hit the left side of her head on the sidewalk. Denies LOC.

## 2018-11-30 NOTE — Discharge Instructions (Addendum)
Please keep your abrasion on your hand clean and dry.  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  Return for any problems including increasing pain redness or swelling.  Tylenol for any pain.

## 2018-11-30 NOTE — ED Provider Notes (Signed)
[facility] Emergency Department Provider Note   ____________________________________________   First MD Initiated Contact with Patient 11/30/18 [phone]     (approximate)  I have reviewed the triage vital signs and the nursing notes.   HISTORY  Chief Complaint Fall    HPI Samantha Travis is a 72 y.o. female who works here in the hospital she was walking and tripped over an uneven area in the concrete fell she has abrasions on her left hand bruises on both knees and she hit her head as well.  She did not pass out but she has some pain in the area of the left temple and the left side of the orbit left orbit   Past Medical History:  Diagnosis Date  . Diabetes mellitus without complication (HCC)   . Thyroid disease     There are no active problems to display for this patient.   History reviewed. No pertinent surgical history.  Prior to Admission medications   Not on File    Allergies Penicillins  No family history on file.  Social History Social History   Tobacco Use  . Smoking status: Never Smoker  . Smokeless tobacco: Never Used  Substance Use Topics  . Alcohol use: Never    Frequency: Never  . Drug use: Not on file    Review of Systems  Constitutional: No fever/chills Eyes: No visual changes. ENT: No sore throat. Cardiovascular: Denies chest pain. Respiratory: Denies shortness of breath. Gastrointestinal: No abdominal pain.  No nausea, no vomiting.  No diarrhea.  No constipation. Genitourinary: Negative for dysuria. Musculoskeletal: Negative for back pain. Skin: Negative for rash. Neurological: Negative for focal weakness patient does have a headache on the left side of her head where she hit it ____________________________________________   PHYSICAL EXAM:  VITAL SIGNS: ED Triage Vitals [11/30/18 0810]  Enc Vitals Group     BP (!) 182/86     Pulse Rate 83     Resp 16     Temp 98.1 F (36.7 C)     Temp Source Oral       SpO2 100 %     Weight 150 lb (68 kg)     Height 5' (1.524 m)     Head Circumference      Peak Flow      Pain Score 0     Pain Loc      Pain Edu?      Excl. in GC?     Constitutional: Alert and oriented. Well appearing and in no acute distress. Eyes: Conjunctivae are normal.  Head: Atraumatic although painful in the area described in HPI from the fall. Nose: No congestion/rhinnorhea. Mouth/Throat: Mucous membranes are moist.  Oropharynx non-erythematous. Neck: No stridor.  No cervical tenderness  cardiovascular: Normal rate, regular rhythm. Grossly normal heart sounds.  Good peripheral circulation. Respiratory: Normal respiratory effort.  No retractions. Lungs CTAB. Gastrointestinal: Soft and nontender. No distention. No abdominal bruits. Musculoskeletal: No lower extremity  Edema both knees are bruised.  The left hand has an abrasion over the thenar eminence and somewhat tender in the dorsum of the palm of the hand and distal wrist as well..   Neurologic:  Normal speech and language. No gross focal neurologic deficits are appreciated.  Skin:  Skin is warm, dry and intact except for as noted above in musculoskeletal. No rash noted. Psychiatric: Mood and affect are normal. Speech and behavior are normal.  ____________________________________________   LABS (all labs ordered are listed, but  only abnormal results are displayed)  Labs Reviewed - No data to display ____________________________________________  EKG   ____________________________________________  RADIOLOGY  ED MD interpretation: CT and wrist x-ray read as no acute problems by radiology I reviewed all the films  Official radiology report(s): Ct Head Wo Contrast  Result Date: 11/30/2018 CLINICAL DATA:  Recent trip and fall with headaches, initial encounter EXAM: CT HEAD WITHOUT CONTRAST TECHNIQUE: Contiguous axial images were obtained from the base of the skull through the vertex without intravenous contrast.  COMPARISON:  None. FINDINGS: Brain: No findings to suggest acute hemorrhage, acute infarction or space-occupying mass lesion are seen. Very mild chronic white matter ischemic changes are noted in the deep white matter on the left without significant sulcal blunting. Vascular: No hyperdense vessel or unexpected calcification. Skull: Normal. Negative for fracture or focal lesion. Sinuses/Orbits: No acute finding. Other: None. IMPRESSION: Mild chronic white matter ischemic change. No acute abnormality noted. Electronically Signed   By: Alcide Clever M.D.   On: 11/30/2018 09:18   Dg Hand Complete Left  Result Date: 11/30/2018 CLINICAL DATA:  Left hand injury due to a trip and fall today. Initial encounter. EXAM: LEFT HAND - COMPLETE 3+ VIEW COMPARISON:  None. FINDINGS: No acute bony or joint abnormality is identified. Scattered interphalangeal joint space narrowing and osteophytosis consistent with osteoarthritis noted. There is also mild degenerative change at the first [facility] joint. Soft tissues are unremarkable. IMPRESSION: No acute abnormality. Scattered osteoarthritis. Electronically Signed   By: Drusilla Kanner M.D.   On: 11/30/2018 09:08    ____________________________________________   PROCEDURES  Procedure(s) performed:  Procedures  Critical Care performed:   ____________________________________________   INITIAL IMPRESSION / ASSESSMENT AND PLAN / ED COURSE  We will clean and dress the abrasion on patient's hand otherwise she says her tetanus shots up-to-date is been within the last 10 years we will let her go         ____________________________________________   FINAL CLINICAL IMPRESSION(S) / ED DIAGNOSES  Final diagnoses:  Fall, initial encounter  Abrasion     ED Discharge Orders    None       Note:  This document was prepared using Dragon voice recognition software and may include unintentional dictation errors.    Arnaldo Natal, MD 11/30/18 [phone]

## 2018-12-03 ENCOUNTER — Encounter: Payer: Self-pay | Admitting: Dietician

## 2018-12-24 ENCOUNTER — Ambulatory Visit
Admission: RE | Admit: 2018-12-24 | Discharge: 2018-12-24 | Disposition: A | Discharge status: Home / Self Care | Payer: PRIVATE HEALTH INSURANCE | Attending: Family Medicine | Admitting: Family Medicine

## 2018-12-24 ENCOUNTER — Other Ambulatory Visit: Payer: Self-pay | Admitting: Family Medicine

## 2018-12-24 ENCOUNTER — Other Ambulatory Visit: Payer: Self-pay | Admitting: Internal Medicine

## 2018-12-24 ENCOUNTER — Ambulatory Visit
Admission: RE | Admit: 2018-12-24 | Discharge: 2018-12-24 | Disposition: A | Discharge status: Home / Self Care | Payer: PRIVATE HEALTH INSURANCE | Source: Ambulatory Visit | Attending: Family Medicine | Admitting: Family Medicine

## 2018-12-24 DIAGNOSIS — M25532 Pain in left wrist: Secondary | ICD-10-CM | POA: Insufficient documentation

## 2018-12-24 DIAGNOSIS — R52 Pain, unspecified: Secondary | ICD-10-CM

## 2018-12-24 DIAGNOSIS — Z1231 Encounter for screening mammogram for malignant neoplasm of breast: Secondary | ICD-10-CM

## 2020-04-09 ENCOUNTER — Other Ambulatory Visit: Payer: Self-pay | Admitting: Internal Medicine

## 2020-04-09 DIAGNOSIS — R945 Abnormal results of liver function studies: Secondary | ICD-10-CM

## 2020-04-09 DIAGNOSIS — R7989 Other specified abnormal findings of blood chemistry: Secondary | ICD-10-CM

## 2020-04-17 ENCOUNTER — Other Ambulatory Visit: Payer: Self-pay

## 2020-04-17 ENCOUNTER — Ambulatory Visit
Admission: RE | Admit: 2020-04-17 | Discharge: 2020-04-17 | Disposition: A | Discharge status: Home / Self Care | Payer: Medicare HMO | Source: Ambulatory Visit | Attending: Internal Medicine | Admitting: Internal Medicine

## 2020-04-17 DIAGNOSIS — R7989 Other specified abnormal findings of blood chemistry: Secondary | ICD-10-CM

## 2020-04-17 DIAGNOSIS — R945 Abnormal results of liver function studies: Secondary | ICD-10-CM | POA: Diagnosis not present

## 2020-07-16 ENCOUNTER — Other Ambulatory Visit: Payer: Self-pay | Admitting: Internal Medicine

## 2020-07-16 DIAGNOSIS — Z1231 Encounter for screening mammogram for malignant neoplasm of breast: Secondary | ICD-10-CM

## 2021-03-22 DIAGNOSIS — E7800 Pure hypercholesterolemia, unspecified: Secondary | ICD-10-CM | POA: Diagnosis not present

## 2021-03-22 DIAGNOSIS — E538 Deficiency of other specified B group vitamins: Secondary | ICD-10-CM | POA: Diagnosis not present

## 2021-03-22 DIAGNOSIS — E559 Vitamin D deficiency, unspecified: Secondary | ICD-10-CM | POA: Diagnosis not present

## 2021-03-22 DIAGNOSIS — E119 Type 2 diabetes mellitus without complications: Secondary | ICD-10-CM | POA: Diagnosis not present

## 2021-03-22 DIAGNOSIS — E1165 Type 2 diabetes mellitus with hyperglycemia: Secondary | ICD-10-CM | POA: Diagnosis not present

## 2021-03-22 DIAGNOSIS — Z0000 Encounter for general adult medical examination without abnormal findings: Secondary | ICD-10-CM | POA: Diagnosis not present

## 2021-03-22 DIAGNOSIS — E039 Hypothyroidism, unspecified: Secondary | ICD-10-CM | POA: Diagnosis not present

## 2021-03-26 ENCOUNTER — Other Ambulatory Visit: Payer: Self-pay | Admitting: Internal Medicine

## 2021-03-26 DIAGNOSIS — Z1231 Encounter for screening mammogram for malignant neoplasm of breast: Secondary | ICD-10-CM | POA: Diagnosis not present

## 2021-03-26 DIAGNOSIS — E119 Type 2 diabetes mellitus without complications: Secondary | ICD-10-CM | POA: Diagnosis not present

## 2021-03-26 DIAGNOSIS — E039 Hypothyroidism, unspecified: Secondary | ICD-10-CM | POA: Diagnosis not present

## 2021-03-26 DIAGNOSIS — Z0000 Encounter for general adult medical examination without abnormal findings: Secondary | ICD-10-CM | POA: Diagnosis not present

## 2021-03-26 DIAGNOSIS — R519 Headache, unspecified: Secondary | ICD-10-CM | POA: Diagnosis not present

## 2021-03-29 ENCOUNTER — Other Ambulatory Visit: Payer: Self-pay | Admitting: Internal Medicine

## 2021-04-07 ENCOUNTER — Other Ambulatory Visit: Payer: Self-pay

## 2021-04-07 ENCOUNTER — Ambulatory Visit
Admission: RE | Admit: 2021-04-07 | Discharge: 2021-04-07 | Disposition: A | Discharge status: Home / Self Care | Payer: Medicare HMO | Source: Ambulatory Visit | Attending: Internal Medicine | Admitting: Internal Medicine

## 2021-04-07 DIAGNOSIS — Z1231 Encounter for screening mammogram for malignant neoplasm of breast: Secondary | ICD-10-CM | POA: Diagnosis not present

## 2021-05-03 DIAGNOSIS — M7731 Calcaneal spur, right foot: Secondary | ICD-10-CM | POA: Diagnosis not present

## 2021-05-03 DIAGNOSIS — M19071 Primary osteoarthritis, right ankle and foot: Secondary | ICD-10-CM | POA: Diagnosis not present

## 2021-05-03 DIAGNOSIS — M79671 Pain in right foot: Secondary | ICD-10-CM | POA: Diagnosis not present

## 2021-05-03 DIAGNOSIS — E785 Hyperlipidemia, unspecified: Secondary | ICD-10-CM | POA: Diagnosis not present

## 2021-05-03 DIAGNOSIS — M1712 Unilateral primary osteoarthritis, left knee: Secondary | ICD-10-CM | POA: Diagnosis not present

## 2021-05-03 DIAGNOSIS — M79672 Pain in left foot: Secondary | ICD-10-CM | POA: Diagnosis not present

## 2021-05-03 DIAGNOSIS — E119 Type 2 diabetes mellitus without complications: Secondary | ICD-10-CM | POA: Diagnosis not present

## 2021-05-03 DIAGNOSIS — E039 Hypothyroidism, unspecified: Secondary | ICD-10-CM | POA: Diagnosis not present

## 2021-05-03 DIAGNOSIS — Z79899 Other long term (current) drug therapy: Secondary | ICD-10-CM | POA: Diagnosis not present

## 2021-07-19 DIAGNOSIS — Z1231 Encounter for screening mammogram for malignant neoplasm of breast: Secondary | ICD-10-CM | POA: Diagnosis not present

## 2021-07-19 DIAGNOSIS — E039 Hypothyroidism, unspecified: Secondary | ICD-10-CM | POA: Diagnosis not present

## 2021-07-19 DIAGNOSIS — E119 Type 2 diabetes mellitus without complications: Secondary | ICD-10-CM | POA: Diagnosis not present

## 2021-07-19 DIAGNOSIS — R519 Headache, unspecified: Secondary | ICD-10-CM | POA: Diagnosis not present

## 2021-08-02 DIAGNOSIS — E119 Type 2 diabetes mellitus without complications: Secondary | ICD-10-CM | POA: Diagnosis not present

## 2021-08-02 DIAGNOSIS — E039 Hypothyroidism, unspecified: Secondary | ICD-10-CM | POA: Diagnosis not present

## 2021-08-02 DIAGNOSIS — M79671 Pain in right foot: Secondary | ICD-10-CM | POA: Diagnosis not present

## 2021-08-02 DIAGNOSIS — R0789 Other chest pain: Secondary | ICD-10-CM | POA: Diagnosis not present

## 2021-08-06 DIAGNOSIS — N899 Noninflammatory disorder of vagina, unspecified: Secondary | ICD-10-CM | POA: Diagnosis not present

## 2021-08-06 DIAGNOSIS — M5489 Other dorsalgia: Secondary | ICD-10-CM | POA: Diagnosis not present

## 2021-08-06 DIAGNOSIS — R0789 Other chest pain: Secondary | ICD-10-CM | POA: Diagnosis not present

## 2021-09-15 DIAGNOSIS — E119 Type 2 diabetes mellitus without complications: Secondary | ICD-10-CM | POA: Diagnosis not present

## 2021-09-15 DIAGNOSIS — Z0100 Encounter for examination of eyes and vision without abnormal findings: Secondary | ICD-10-CM | POA: Diagnosis not present

## 2021-09-29 DIAGNOSIS — Z0100 Encounter for examination of eyes and vision without abnormal findings: Secondary | ICD-10-CM | POA: Diagnosis not present

## 2021-11-24 DIAGNOSIS — M79671 Pain in right foot: Secondary | ICD-10-CM | POA: Diagnosis not present

## 2021-11-24 DIAGNOSIS — R0789 Other chest pain: Secondary | ICD-10-CM | POA: Diagnosis not present

## 2021-11-24 DIAGNOSIS — E1165 Type 2 diabetes mellitus with hyperglycemia: Secondary | ICD-10-CM | POA: Diagnosis not present

## 2021-11-24 DIAGNOSIS — E039 Hypothyroidism, unspecified: Secondary | ICD-10-CM | POA: Diagnosis not present

## 2021-12-01 DIAGNOSIS — E039 Hypothyroidism, unspecified: Secondary | ICD-10-CM | POA: Diagnosis not present

## 2021-12-01 DIAGNOSIS — E119 Type 2 diabetes mellitus without complications: Secondary | ICD-10-CM | POA: Diagnosis not present

## 2021-12-01 DIAGNOSIS — M1712 Unilateral primary osteoarthritis, left knee: Secondary | ICD-10-CM | POA: Diagnosis not present

## 2021-12-01 DIAGNOSIS — E785 Hyperlipidemia, unspecified: Secondary | ICD-10-CM | POA: Diagnosis not present

## 2022-03-21 DIAGNOSIS — E039 Hypothyroidism, unspecified: Secondary | ICD-10-CM | POA: Diagnosis not present

## 2022-03-21 DIAGNOSIS — E119 Type 2 diabetes mellitus without complications: Secondary | ICD-10-CM | POA: Diagnosis not present

## 2022-03-21 DIAGNOSIS — R0781 Pleurodynia: Secondary | ICD-10-CM | POA: Diagnosis not present

## 2022-03-21 DIAGNOSIS — M898X1 Other specified disorders of bone, shoulder: Secondary | ICD-10-CM | POA: Diagnosis not present

## 2022-03-21 DIAGNOSIS — R079 Chest pain, unspecified: Secondary | ICD-10-CM | POA: Diagnosis not present

## 2022-03-24 ENCOUNTER — Other Ambulatory Visit: Payer: Self-pay | Admitting: Internal Medicine

## 2022-03-24 DIAGNOSIS — Z1231 Encounter for screening mammogram for malignant neoplasm of breast: Secondary | ICD-10-CM

## 2022-04-04 DIAGNOSIS — E039 Hypothyroidism, unspecified: Secondary | ICD-10-CM | POA: Diagnosis not present

## 2022-04-04 DIAGNOSIS — E7800 Pure hypercholesterolemia, unspecified: Secondary | ICD-10-CM | POA: Diagnosis not present

## 2022-04-04 DIAGNOSIS — E1165 Type 2 diabetes mellitus with hyperglycemia: Secondary | ICD-10-CM | POA: Diagnosis not present

## 2022-04-04 DIAGNOSIS — M1712 Unilateral primary osteoarthritis, left knee: Secondary | ICD-10-CM | POA: Diagnosis not present

## 2022-04-13 DIAGNOSIS — E039 Hypothyroidism, unspecified: Secondary | ICD-10-CM | POA: Diagnosis not present

## 2022-04-13 DIAGNOSIS — Z1389 Encounter for screening for other disorder: Secondary | ICD-10-CM | POA: Diagnosis not present

## 2022-04-13 DIAGNOSIS — E119 Type 2 diabetes mellitus without complications: Secondary | ICD-10-CM | POA: Diagnosis not present

## 2022-04-13 DIAGNOSIS — Z6828 Body mass index (BMI) 28.0-28.9, adult: Secondary | ICD-10-CM | POA: Diagnosis not present

## 2022-04-13 DIAGNOSIS — Z0000 Encounter for general adult medical examination without abnormal findings: Secondary | ICD-10-CM | POA: Diagnosis not present

## 2022-04-13 DIAGNOSIS — M1712 Unilateral primary osteoarthritis, left knee: Secondary | ICD-10-CM | POA: Diagnosis not present

## 2022-04-13 DIAGNOSIS — E785 Hyperlipidemia, unspecified: Secondary | ICD-10-CM | POA: Diagnosis not present

## 2022-04-27 ENCOUNTER — Ambulatory Visit
Admission: RE | Admit: 2022-04-27 | Discharge: 2022-04-27 | Disposition: A | Discharge status: Home / Self Care | Payer: Medicare HMO | Source: Ambulatory Visit | Attending: Internal Medicine | Admitting: Internal Medicine

## 2022-04-27 DIAGNOSIS — Z1231 Encounter for screening mammogram for malignant neoplasm of breast: Secondary | ICD-10-CM | POA: Insufficient documentation

## 2022-07-07 DIAGNOSIS — E1165 Type 2 diabetes mellitus with hyperglycemia: Secondary | ICD-10-CM | POA: Diagnosis not present

## 2022-07-07 DIAGNOSIS — E7800 Pure hypercholesterolemia, unspecified: Secondary | ICD-10-CM | POA: Diagnosis not present

## 2022-07-07 DIAGNOSIS — E039 Hypothyroidism, unspecified: Secondary | ICD-10-CM | POA: Diagnosis not present

## 2022-07-13 DIAGNOSIS — E785 Hyperlipidemia, unspecified: Secondary | ICD-10-CM | POA: Diagnosis not present

## 2022-07-13 DIAGNOSIS — M5416 Radiculopathy, lumbar region: Secondary | ICD-10-CM | POA: Diagnosis not present

## 2022-07-13 DIAGNOSIS — E039 Hypothyroidism, unspecified: Secondary | ICD-10-CM | POA: Diagnosis not present

## 2022-07-13 DIAGNOSIS — M1712 Unilateral primary osteoarthritis, left knee: Secondary | ICD-10-CM | POA: Diagnosis not present

## 2022-07-13 DIAGNOSIS — E119 Type 2 diabetes mellitus without complications: Secondary | ICD-10-CM | POA: Diagnosis not present

## 2022-08-24 DIAGNOSIS — H6123 Impacted cerumen, bilateral: Secondary | ICD-10-CM | POA: Diagnosis not present

## 2022-08-31 DIAGNOSIS — Z6828 Body mass index (BMI) 28.0-28.9, adult: Secondary | ICD-10-CM | POA: Diagnosis not present

## 2022-08-31 DIAGNOSIS — H6123 Impacted cerumen, bilateral: Secondary | ICD-10-CM | POA: Diagnosis not present

## 2022-08-31 DIAGNOSIS — H66003 Acute suppurative otitis media without spontaneous rupture of ear drum, bilateral: Secondary | ICD-10-CM | POA: Diagnosis not present

## 2022-10-31 DIAGNOSIS — M5136 Other intervertebral disc degeneration, lumbar region: Secondary | ICD-10-CM | POA: Diagnosis not present

## 2022-10-31 DIAGNOSIS — E1165 Type 2 diabetes mellitus with hyperglycemia: Secondary | ICD-10-CM | POA: Diagnosis not present

## 2022-10-31 DIAGNOSIS — E559 Vitamin D deficiency, unspecified: Secondary | ICD-10-CM | POA: Diagnosis not present

## 2022-10-31 DIAGNOSIS — M1712 Unilateral primary osteoarthritis, left knee: Secondary | ICD-10-CM | POA: Diagnosis not present

## 2022-10-31 DIAGNOSIS — E7800 Pure hypercholesterolemia, unspecified: Secondary | ICD-10-CM | POA: Diagnosis not present

## 2022-10-31 DIAGNOSIS — E039 Hypothyroidism, unspecified: Secondary | ICD-10-CM | POA: Diagnosis not present

## 2022-11-07 DIAGNOSIS — E785 Hyperlipidemia, unspecified: Secondary | ICD-10-CM | POA: Diagnosis not present

## 2022-11-07 DIAGNOSIS — D649 Anemia, unspecified: Secondary | ICD-10-CM | POA: Diagnosis not present

## 2022-11-07 DIAGNOSIS — E119 Type 2 diabetes mellitus without complications: Secondary | ICD-10-CM | POA: Diagnosis not present

## 2022-11-07 DIAGNOSIS — M5136 Other intervertebral disc degeneration, lumbar region: Secondary | ICD-10-CM | POA: Diagnosis not present

## 2022-11-07 DIAGNOSIS — E039 Hypothyroidism, unspecified: Secondary | ICD-10-CM | POA: Diagnosis not present

## 2022-11-07 DIAGNOSIS — I10 Essential (primary) hypertension: Secondary | ICD-10-CM | POA: Diagnosis not present

## 2022-11-07 DIAGNOSIS — Z6829 Body mass index (BMI) 29.0-29.9, adult: Secondary | ICD-10-CM | POA: Diagnosis not present

## 2022-11-14 DIAGNOSIS — I10 Essential (primary) hypertension: Secondary | ICD-10-CM | POA: Diagnosis not present

## 2022-11-21 DIAGNOSIS — I10 Essential (primary) hypertension: Secondary | ICD-10-CM | POA: Diagnosis not present

## 2022-12-08 DIAGNOSIS — E119 Type 2 diabetes mellitus without complications: Secondary | ICD-10-CM | POA: Diagnosis not present

## 2022-12-08 DIAGNOSIS — H524 Presbyopia: Secondary | ICD-10-CM | POA: Diagnosis not present

## 2023-01-19 DIAGNOSIS — R5383 Other fatigue: Secondary | ICD-10-CM | POA: Diagnosis not present

## 2023-01-19 DIAGNOSIS — R5381 Other malaise: Secondary | ICD-10-CM | POA: Diagnosis not present

## 2023-01-19 DIAGNOSIS — E785 Hyperlipidemia, unspecified: Secondary | ICD-10-CM | POA: Diagnosis not present

## 2023-01-19 DIAGNOSIS — R0789 Other chest pain: Secondary | ICD-10-CM | POA: Diagnosis not present

## 2023-01-19 DIAGNOSIS — L918 Other hypertrophic disorders of the skin: Secondary | ICD-10-CM | POA: Diagnosis not present

## 2023-01-19 DIAGNOSIS — E7800 Pure hypercholesterolemia, unspecified: Secondary | ICD-10-CM | POA: Diagnosis not present

## 2023-01-19 DIAGNOSIS — E119 Type 2 diabetes mellitus without complications: Secondary | ICD-10-CM | POA: Diagnosis not present

## 2023-01-19 DIAGNOSIS — E039 Hypothyroidism, unspecified: Secondary | ICD-10-CM | POA: Diagnosis not present

## 2023-01-19 DIAGNOSIS — R519 Headache, unspecified: Secondary | ICD-10-CM | POA: Diagnosis not present

## 2023-05-03 ENCOUNTER — Other Ambulatory Visit: Payer: Self-pay | Admitting: Internal Medicine

## 2023-05-03 DIAGNOSIS — Z1231 Encounter for screening mammogram for malignant neoplasm of breast: Secondary | ICD-10-CM

## 2023-05-17 ENCOUNTER — Other Ambulatory Visit: Payer: Self-pay | Admitting: Internal Medicine

## 2023-05-17 DIAGNOSIS — H9193 Unspecified hearing loss, bilateral: Secondary | ICD-10-CM | POA: Diagnosis not present

## 2023-05-17 DIAGNOSIS — R519 Headache, unspecified: Secondary | ICD-10-CM

## 2023-05-17 DIAGNOSIS — Z6830 Body mass index (BMI) 30.0-30.9, adult: Secondary | ICD-10-CM | POA: Diagnosis not present

## 2023-05-17 DIAGNOSIS — Z1331 Encounter for screening for depression: Secondary | ICD-10-CM | POA: Diagnosis not present

## 2023-05-17 DIAGNOSIS — E119 Type 2 diabetes mellitus without complications: Secondary | ICD-10-CM | POA: Diagnosis not present

## 2023-05-17 DIAGNOSIS — E039 Hypothyroidism, unspecified: Secondary | ICD-10-CM | POA: Diagnosis not present

## 2023-05-17 DIAGNOSIS — Z0000 Encounter for general adult medical examination without abnormal findings: Secondary | ICD-10-CM | POA: Diagnosis not present

## 2023-05-17 DIAGNOSIS — E785 Hyperlipidemia, unspecified: Secondary | ICD-10-CM | POA: Diagnosis not present

## 2023-05-17 DIAGNOSIS — E669 Obesity, unspecified: Secondary | ICD-10-CM | POA: Diagnosis not present

## 2023-05-19 ENCOUNTER — Ambulatory Visit
Admission: RE | Admit: 2023-05-19 | Discharge: 2023-05-19 | Disposition: A | Discharge status: Home / Self Care | Payer: Medicare HMO | Source: Ambulatory Visit | Attending: Internal Medicine | Admitting: Internal Medicine

## 2023-05-19 DIAGNOSIS — Z1231 Encounter for screening mammogram for malignant neoplasm of breast: Secondary | ICD-10-CM | POA: Insufficient documentation

## 2023-05-31 ENCOUNTER — Ambulatory Visit
Admission: RE | Admit: 2023-05-31 | Discharge: 2023-05-31 | Disposition: A | Discharge status: Home / Self Care | Payer: Medicare HMO | Source: Ambulatory Visit | Attending: Internal Medicine | Admitting: Internal Medicine

## 2023-05-31 DIAGNOSIS — R519 Headache, unspecified: Secondary | ICD-10-CM | POA: Insufficient documentation

## 2023-06-05 DIAGNOSIS — E119 Type 2 diabetes mellitus without complications: Secondary | ICD-10-CM | POA: Diagnosis not present

## 2023-06-05 DIAGNOSIS — L298 Other pruritus: Secondary | ICD-10-CM | POA: Diagnosis not present

## 2023-06-05 DIAGNOSIS — E039 Hypothyroidism, unspecified: Secondary | ICD-10-CM | POA: Diagnosis not present

## 2023-07-21 IMAGING — MG MM DIGITAL SCREENING BILAT W/ TOMO AND CAD
8 series · 8 of 24 positions shown · non-contrast
Comparison: Previous exam(s).

CLINICAL DATA: Screening.

EXAM:
DIGITAL SCREENING BILATERAL MAMMOGRAM WITH TOMOSYNTHESIS AND CAD
TECHNIQUE: Bilateral screening digital craniocaudal and mediolateral oblique
mammograms were obtained. Bilateral screening digital breast
tomosynthesis was performed. The images were evaluated with
computer-aided detection.

[L MLO synth-2D]
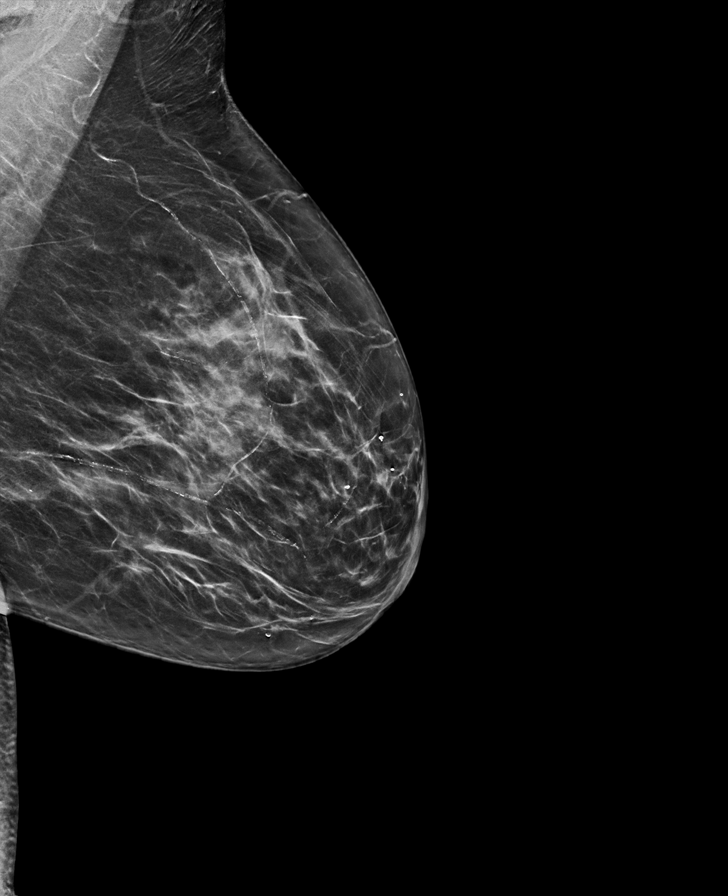

[R MLO synth-2D]
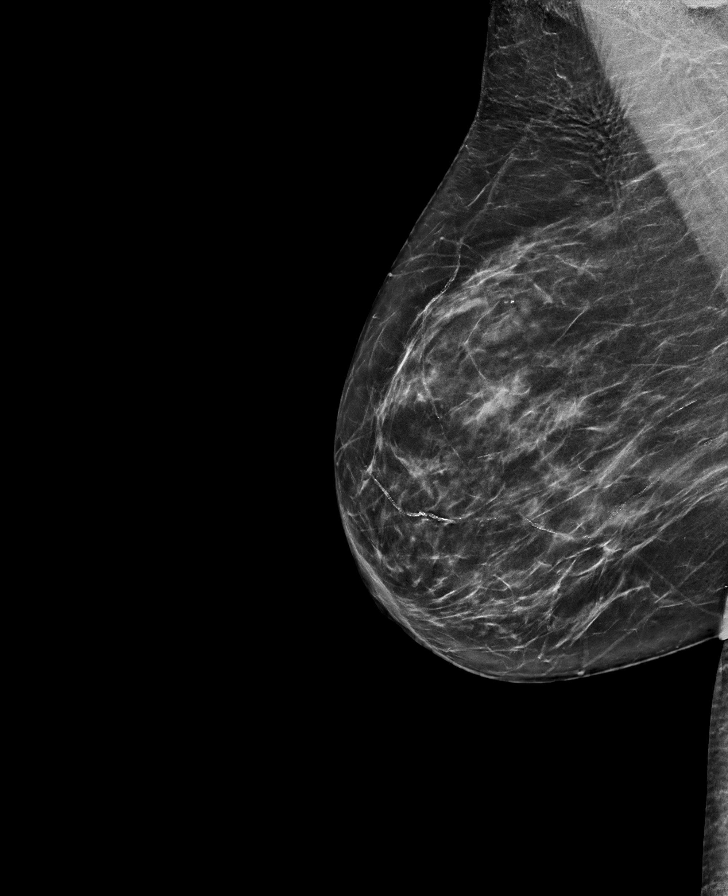

[R CC synth-2D]
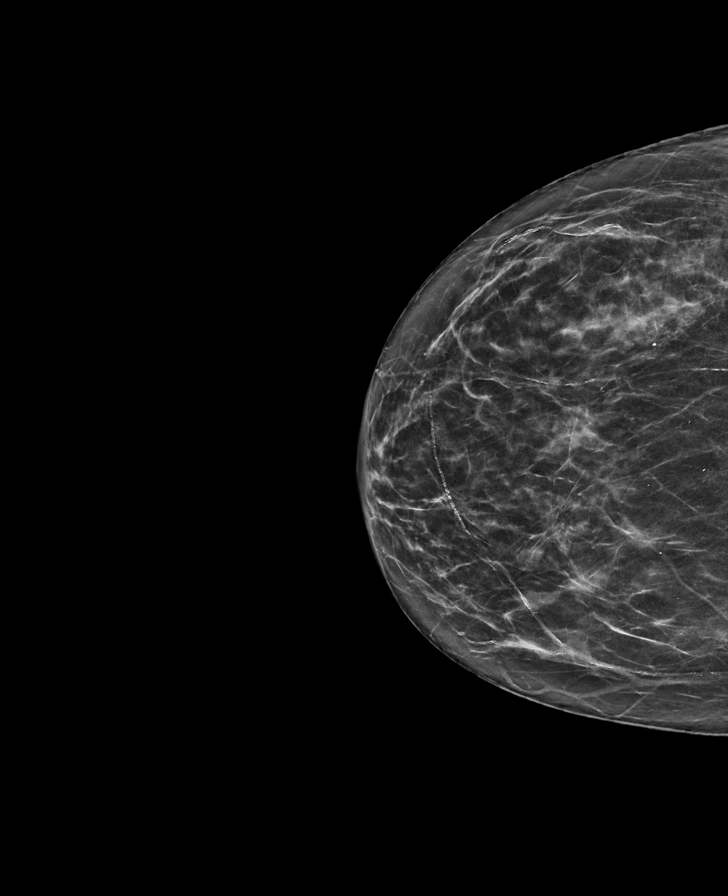

[L CC synth-2D]
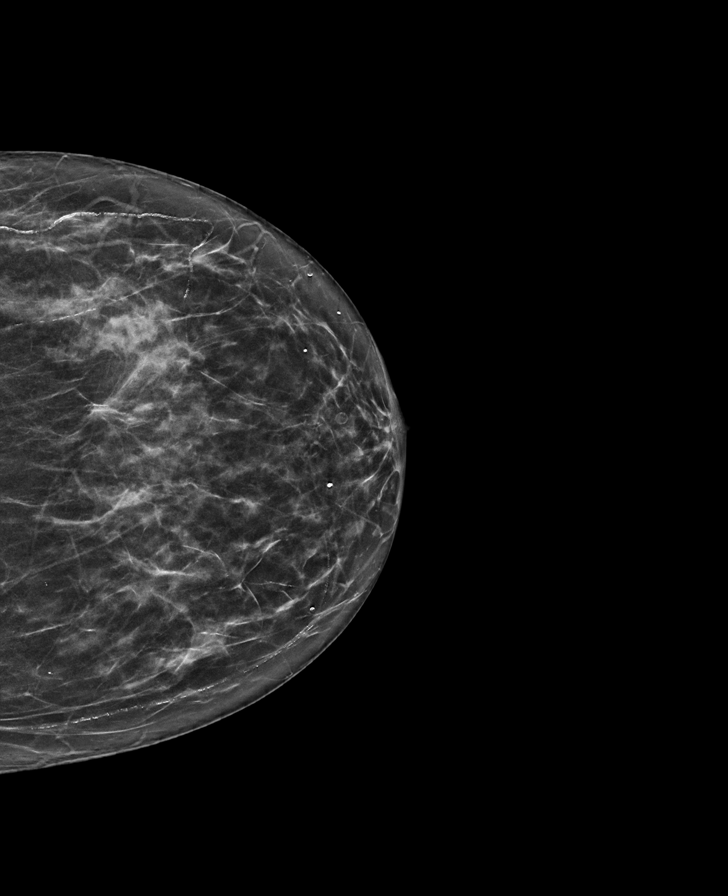

[L MLO tomo · tomo slice 37/74.0]
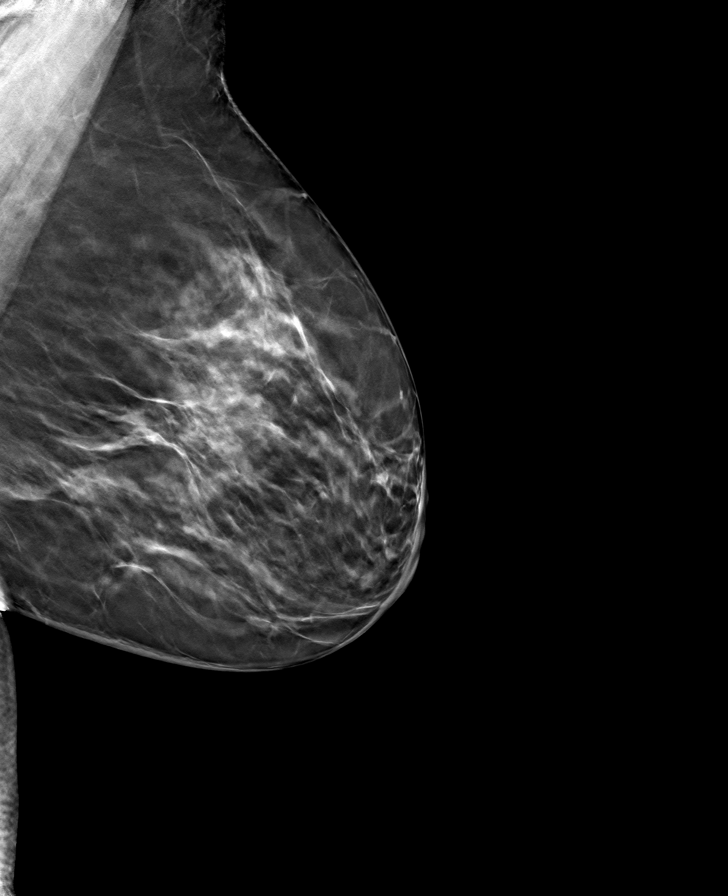

[R MLO tomo · tomo slice 37/73.0]
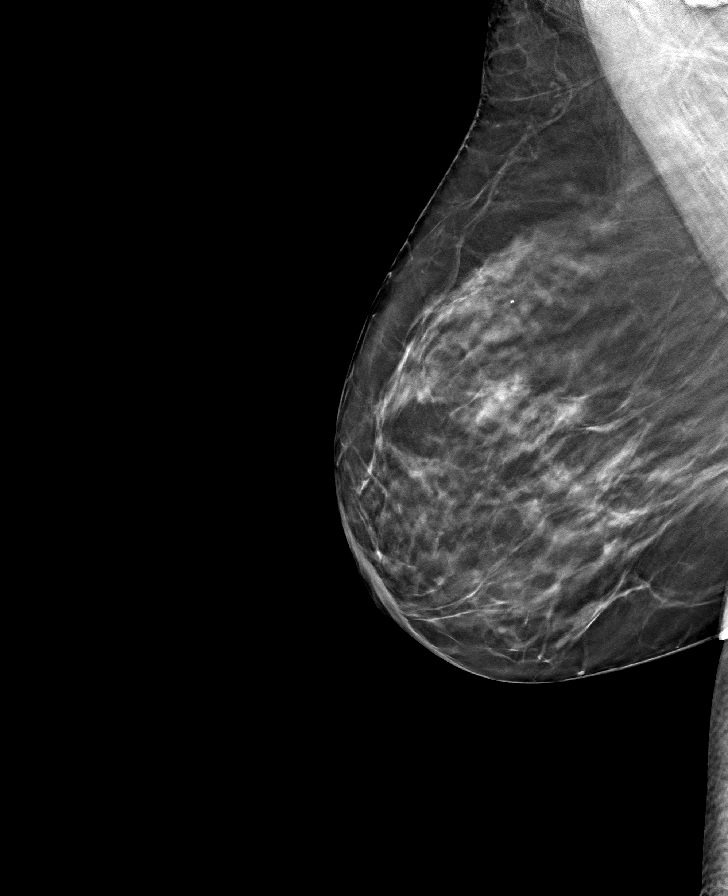

[R CC tomo · tomo slice 31/61.0]
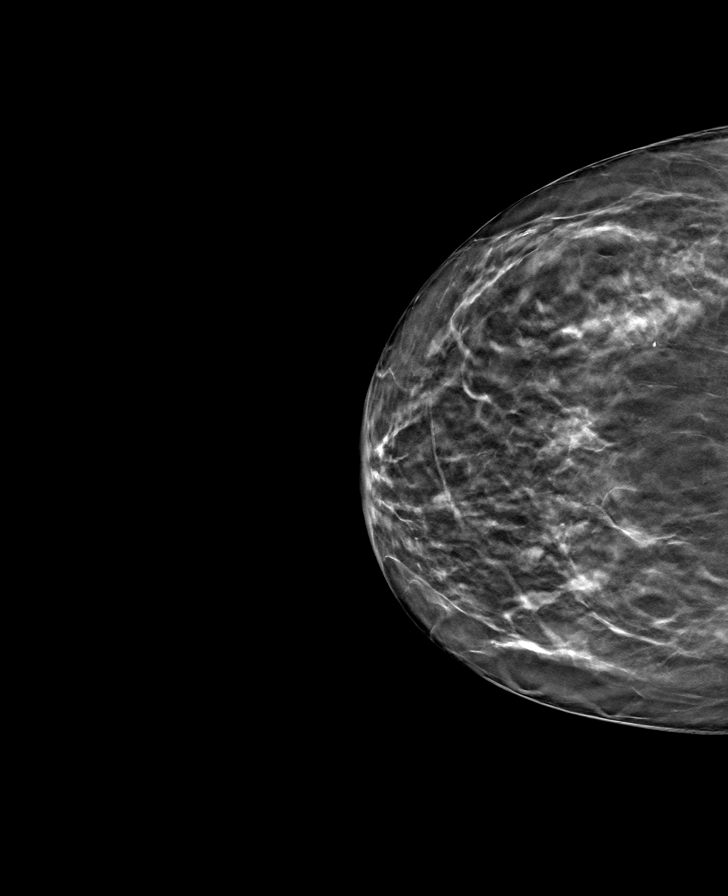

[L CC tomo · tomo slice 31/61.0]
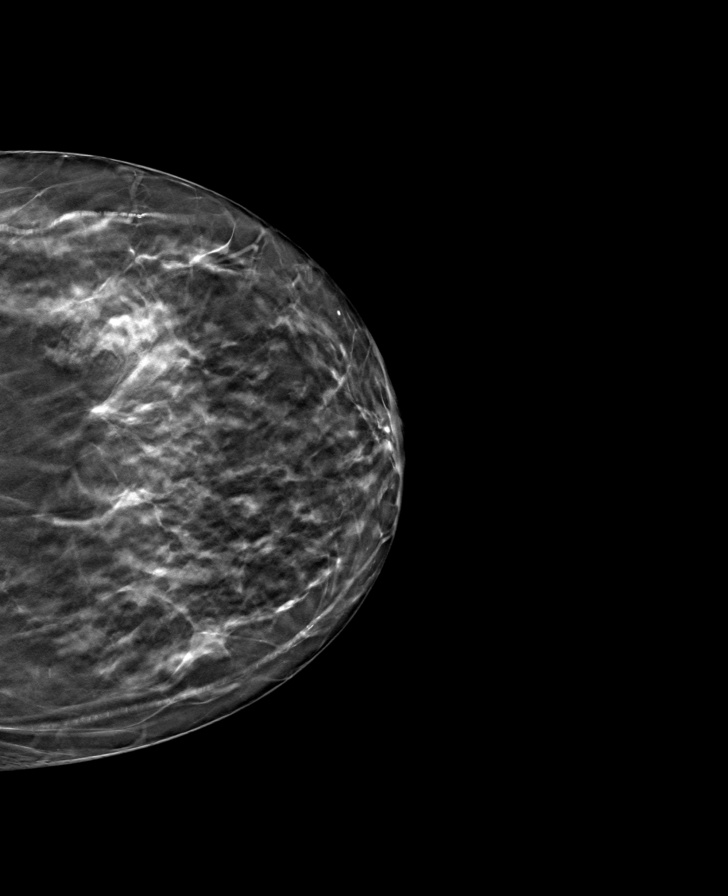

[8 of 24 positions shown; findings below may reference images not displayed]

ACR Breast Density Category c: The breast tissue is heterogeneously
dense, which may obscure small masses.
FINDINGS: There are no findings suspicious for malignancy.
IMPRESSION: No mammographic evidence of malignancy. A result letter of this
screening mammogram will be mailed directly to the patient.

RECOMMENDATION:
Screening mammogram in one year. (Code:Q3-W-BC3)

BI-RADS CATEGORY  1: Negative.

## 2023-07-24 DIAGNOSIS — H7403 Tympanosclerosis, bilateral: Secondary | ICD-10-CM | POA: Diagnosis not present

## 2023-07-24 DIAGNOSIS — H903 Sensorineural hearing loss, bilateral: Secondary | ICD-10-CM | POA: Diagnosis not present

## 2023-07-24 DIAGNOSIS — H60332 Swimmer's ear, left ear: Secondary | ICD-10-CM | POA: Diagnosis not present

## 2023-07-24 DIAGNOSIS — H6123 Impacted cerumen, bilateral: Secondary | ICD-10-CM | POA: Diagnosis not present

## 2023-09-19 DIAGNOSIS — E559 Vitamin D deficiency, unspecified: Secondary | ICD-10-CM | POA: Diagnosis not present

## 2023-09-19 DIAGNOSIS — E782 Mixed hyperlipidemia: Secondary | ICD-10-CM | POA: Diagnosis not present

## 2023-09-19 DIAGNOSIS — E039 Hypothyroidism, unspecified: Secondary | ICD-10-CM | POA: Diagnosis not present

## 2023-09-19 DIAGNOSIS — E1165 Type 2 diabetes mellitus with hyperglycemia: Secondary | ICD-10-CM | POA: Diagnosis not present

## 2023-09-19 DIAGNOSIS — R519 Headache, unspecified: Secondary | ICD-10-CM | POA: Diagnosis not present

## 2023-09-19 DIAGNOSIS — H9193 Unspecified hearing loss, bilateral: Secondary | ICD-10-CM | POA: Diagnosis not present

## 2023-09-19 DIAGNOSIS — Z6830 Body mass index (BMI) 30.0-30.9, adult: Secondary | ICD-10-CM | POA: Diagnosis not present

## 2023-09-26 DIAGNOSIS — H9190 Unspecified hearing loss, unspecified ear: Secondary | ICD-10-CM | POA: Diagnosis not present

## 2023-09-26 DIAGNOSIS — Z13820 Encounter for screening for osteoporosis: Secondary | ICD-10-CM | POA: Diagnosis not present

## 2023-09-26 DIAGNOSIS — R5381 Other malaise: Secondary | ICD-10-CM | POA: Diagnosis not present

## 2023-09-26 DIAGNOSIS — R519 Headache, unspecified: Secondary | ICD-10-CM | POA: Diagnosis not present

## 2023-09-26 DIAGNOSIS — R5383 Other fatigue: Secondary | ICD-10-CM | POA: Diagnosis not present

## 2023-09-26 DIAGNOSIS — E039 Hypothyroidism, unspecified: Secondary | ICD-10-CM | POA: Diagnosis not present

## 2023-09-26 DIAGNOSIS — M1712 Unilateral primary osteoarthritis, left knee: Secondary | ICD-10-CM | POA: Diagnosis not present

## 2023-09-26 DIAGNOSIS — Z780 Asymptomatic menopausal state: Secondary | ICD-10-CM | POA: Diagnosis not present

## 2023-09-26 DIAGNOSIS — E119 Type 2 diabetes mellitus without complications: Secondary | ICD-10-CM | POA: Diagnosis not present

## 2023-10-09 DIAGNOSIS — M8588 Other specified disorders of bone density and structure, other site: Secondary | ICD-10-CM | POA: Diagnosis not present

## 2023-12-13 DIAGNOSIS — E119 Type 2 diabetes mellitus without complications: Secondary | ICD-10-CM | POA: Diagnosis not present

## 2023-12-13 DIAGNOSIS — H2513 Age-related nuclear cataract, bilateral: Secondary | ICD-10-CM | POA: Diagnosis not present

## 2023-12-13 DIAGNOSIS — Z0100 Encounter for examination of eyes and vision without abnormal findings: Secondary | ICD-10-CM | POA: Diagnosis not present

## 2023-12-14 DIAGNOSIS — Z0100 Encounter for examination of eyes and vision without abnormal findings: Secondary | ICD-10-CM | POA: Diagnosis not present

## 2024-01-25 DIAGNOSIS — E1165 Type 2 diabetes mellitus with hyperglycemia: Secondary | ICD-10-CM | POA: Diagnosis not present

## 2024-01-25 DIAGNOSIS — R5383 Other fatigue: Secondary | ICD-10-CM | POA: Diagnosis not present

## 2024-01-25 DIAGNOSIS — R519 Headache, unspecified: Secondary | ICD-10-CM | POA: Diagnosis not present

## 2024-01-25 DIAGNOSIS — H9190 Unspecified hearing loss, unspecified ear: Secondary | ICD-10-CM | POA: Diagnosis not present

## 2024-01-25 DIAGNOSIS — M1712 Unilateral primary osteoarthritis, left knee: Secondary | ICD-10-CM | POA: Diagnosis not present

## 2024-01-25 DIAGNOSIS — R5381 Other malaise: Secondary | ICD-10-CM | POA: Diagnosis not present

## 2024-01-25 DIAGNOSIS — E039 Hypothyroidism, unspecified: Secondary | ICD-10-CM | POA: Diagnosis not present

## 2024-01-25 DIAGNOSIS — E559 Vitamin D deficiency, unspecified: Secondary | ICD-10-CM | POA: Diagnosis not present

## 2024-01-29 DIAGNOSIS — E039 Hypothyroidism, unspecified: Secondary | ICD-10-CM | POA: Diagnosis not present

## 2024-01-29 DIAGNOSIS — E119 Type 2 diabetes mellitus without complications: Secondary | ICD-10-CM | POA: Diagnosis not present

## 2024-01-29 DIAGNOSIS — E785 Hyperlipidemia, unspecified: Secondary | ICD-10-CM | POA: Diagnosis not present

## 2024-01-29 DIAGNOSIS — R413 Other amnesia: Secondary | ICD-10-CM | POA: Diagnosis not present

## 2024-01-29 DIAGNOSIS — Z0000 Encounter for general adult medical examination without abnormal findings: Secondary | ICD-10-CM | POA: Diagnosis not present

## 2024-01-29 DIAGNOSIS — R519 Headache, unspecified: Secondary | ICD-10-CM | POA: Diagnosis not present

## 2024-01-29 DIAGNOSIS — M545 Low back pain, unspecified: Secondary | ICD-10-CM | POA: Diagnosis not present

## 2024-04-22 ENCOUNTER — Other Ambulatory Visit: Payer: Self-pay | Admitting: Internal Medicine

## 2024-04-22 DIAGNOSIS — Z1231 Encounter for screening mammogram for malignant neoplasm of breast: Secondary | ICD-10-CM

## 2024-05-22 ENCOUNTER — Ambulatory Visit
Admission: RE | Admit: 2024-05-22 | Discharge: 2024-05-22 | Disposition: A | Discharge status: Home / Self Care | Source: Ambulatory Visit | Attending: Internal Medicine | Admitting: Internal Medicine

## 2024-05-22 DIAGNOSIS — Z1231 Encounter for screening mammogram for malignant neoplasm of breast: Secondary | ICD-10-CM | POA: Diagnosis not present

## 2024-05-29 DIAGNOSIS — R413 Other amnesia: Secondary | ICD-10-CM | POA: Diagnosis not present

## 2024-05-29 DIAGNOSIS — E663 Overweight: Secondary | ICD-10-CM | POA: Diagnosis not present

## 2024-05-29 DIAGNOSIS — E039 Hypothyroidism, unspecified: Secondary | ICD-10-CM | POA: Diagnosis not present

## 2024-05-29 DIAGNOSIS — E1165 Type 2 diabetes mellitus with hyperglycemia: Secondary | ICD-10-CM | POA: Diagnosis not present

## 2024-05-29 DIAGNOSIS — Z7984 Long term (current) use of oral hypoglycemic drugs: Secondary | ICD-10-CM | POA: Diagnosis not present

## 2024-05-29 DIAGNOSIS — M5136 Other intervertebral disc degeneration, lumbar region with discogenic back pain only: Secondary | ICD-10-CM | POA: Diagnosis not present

## 2024-05-29 DIAGNOSIS — Z6827 Body mass index (BMI) 27.0-27.9, adult: Secondary | ICD-10-CM | POA: Diagnosis not present

## 2024-06-05 DIAGNOSIS — Z1331 Encounter for screening for depression: Secondary | ICD-10-CM | POA: Diagnosis not present

## 2024-06-05 DIAGNOSIS — I10 Essential (primary) hypertension: Secondary | ICD-10-CM | POA: Diagnosis not present

## 2024-06-05 DIAGNOSIS — Z0000 Encounter for general adult medical examination without abnormal findings: Secondary | ICD-10-CM | POA: Diagnosis not present

## 2024-06-05 DIAGNOSIS — E119 Type 2 diabetes mellitus without complications: Secondary | ICD-10-CM | POA: Diagnosis not present

## 2024-06-05 DIAGNOSIS — E039 Hypothyroidism, unspecified: Secondary | ICD-10-CM | POA: Diagnosis not present

## 2024-06-05 DIAGNOSIS — M1712 Unilateral primary osteoarthritis, left knee: Secondary | ICD-10-CM | POA: Diagnosis not present

## 2024-06-05 DIAGNOSIS — E785 Hyperlipidemia, unspecified: Secondary | ICD-10-CM | POA: Diagnosis not present

## 2024-06-05 DIAGNOSIS — H579 Unspecified disorder of eye and adnexa: Secondary | ICD-10-CM | POA: Diagnosis not present

## 2024-07-25 DIAGNOSIS — H6062 Unspecified chronic otitis externa, left ear: Secondary | ICD-10-CM | POA: Diagnosis not present

## 2024-07-25 DIAGNOSIS — H6123 Impacted cerumen, bilateral: Secondary | ICD-10-CM | POA: Diagnosis not present

## 2024-08-12 DIAGNOSIS — H6062 Unspecified chronic otitis externa, left ear: Secondary | ICD-10-CM | POA: Diagnosis not present

## 2024-09-09 ENCOUNTER — Encounter: Payer: Self-pay | Admitting: Otolaryngology

## 2024-09-09 ENCOUNTER — Other Ambulatory Visit: Payer: Self-pay | Admitting: Otolaryngology

## 2024-09-09 DIAGNOSIS — L299 Pruritus, unspecified: Secondary | ICD-10-CM | POA: Diagnosis not present

## 2024-09-09 DIAGNOSIS — H9212 Otorrhea, left ear: Secondary | ICD-10-CM

## 2024-09-09 DIAGNOSIS — H9202 Otalgia, left ear: Secondary | ICD-10-CM | POA: Diagnosis not present

## 2024-09-11 ENCOUNTER — Other Ambulatory Visit

## 2024-09-12 ENCOUNTER — Ambulatory Visit
Admission: RE | Admit: 2024-09-12 | Discharge: 2024-09-12 | Disposition: A | Discharge status: Home / Self Care | Source: Ambulatory Visit | Attending: Otolaryngology | Admitting: Otolaryngology

## 2024-09-12 DIAGNOSIS — J320 Chronic maxillary sinusitis: Secondary | ICD-10-CM | POA: Diagnosis not present

## 2024-09-12 DIAGNOSIS — H9212 Otorrhea, left ear: Secondary | ICD-10-CM

## 2024-09-27 DIAGNOSIS — E039 Hypothyroidism, unspecified: Secondary | ICD-10-CM | POA: Diagnosis not present

## 2024-09-27 DIAGNOSIS — I10 Essential (primary) hypertension: Secondary | ICD-10-CM | POA: Diagnosis not present

## 2024-09-27 DIAGNOSIS — E559 Vitamin D deficiency, unspecified: Secondary | ICD-10-CM | POA: Diagnosis not present

## 2024-09-27 DIAGNOSIS — E1165 Type 2 diabetes mellitus with hyperglycemia: Secondary | ICD-10-CM | POA: Diagnosis not present

## 2024-09-27 DIAGNOSIS — M1712 Unilateral primary osteoarthritis, left knee: Secondary | ICD-10-CM | POA: Diagnosis not present

## 2024-09-27 DIAGNOSIS — R8290 Unspecified abnormal findings in urine: Secondary | ICD-10-CM | POA: Diagnosis not present

## 2024-09-27 DIAGNOSIS — Z1331 Encounter for screening for depression: Secondary | ICD-10-CM | POA: Diagnosis not present

## 2024-10-04 DIAGNOSIS — E785 Hyperlipidemia, unspecified: Secondary | ICD-10-CM | POA: Diagnosis not present

## 2024-10-04 DIAGNOSIS — E039 Hypothyroidism, unspecified: Secondary | ICD-10-CM | POA: Diagnosis not present

## 2024-10-04 DIAGNOSIS — I10 Essential (primary) hypertension: Secondary | ICD-10-CM | POA: Diagnosis not present

## 2024-10-04 DIAGNOSIS — M1712 Unilateral primary osteoarthritis, left knee: Secondary | ICD-10-CM | POA: Diagnosis not present
